# Patient Record
Sex: Female | Born: 1965
Health system: Southern US, Community
[De-identification: ages and names within clinical notes are randomized; demographics above are authoritative.]

## PROBLEM LIST (undated history)

## (undated) HISTORY — PX: CERVICAL CONE BIOPSY: SUR198

## (undated) HISTORY — PX: EYE SURGERY: SHX253

---

## 2017-07-21 HISTORY — PX: REDUCTION MAMMAPLASTY: SUR839

## 2017-12-31 DIAGNOSIS — H6501 Acute serous otitis media, right ear: Secondary | ICD-10-CM | POA: Diagnosis not present

## 2018-01-08 DIAGNOSIS — H9203 Otalgia, bilateral: Secondary | ICD-10-CM | POA: Diagnosis not present

## 2018-01-08 DIAGNOSIS — H9209 Otalgia, unspecified ear: Secondary | ICD-10-CM | POA: Diagnosis not present

## 2018-01-08 DIAGNOSIS — H9190 Unspecified hearing loss, unspecified ear: Secondary | ICD-10-CM | POA: Diagnosis not present

## 2018-01-25 ENCOUNTER — Other Ambulatory Visit: Payer: Self-pay | Admitting: Internal Medicine

## 2018-01-25 DIAGNOSIS — Z1231 Encounter for screening mammogram for malignant neoplasm of breast: Secondary | ICD-10-CM

## 2018-02-10 ENCOUNTER — Ambulatory Visit
Admission: RE | Admit: 2018-02-10 | Discharge: 2018-02-10 | Disposition: A | Discharge status: Home / Self Care | Payer: BLUE CROSS/BLUE SHIELD | Source: Ambulatory Visit | Attending: Internal Medicine | Admitting: Internal Medicine

## 2018-02-10 ENCOUNTER — Encounter: Payer: Self-pay | Admitting: Radiology

## 2018-02-10 DIAGNOSIS — Z1231 Encounter for screening mammogram for malignant neoplasm of breast: Secondary | ICD-10-CM | POA: Insufficient documentation

## 2018-02-24 ENCOUNTER — Other Ambulatory Visit: Payer: Self-pay | Admitting: *Deleted

## 2018-02-24 ENCOUNTER — Inpatient Hospital Stay
Admission: RE | Admit: 2018-02-24 | Discharge: 2018-02-24 | Disposition: A | Discharge status: Home / Self Care | Payer: Self-pay | Source: Ambulatory Visit | Attending: *Deleted | Admitting: *Deleted

## 2018-02-24 DIAGNOSIS — Z9289 Personal history of other medical treatment: Secondary | ICD-10-CM

## 2018-03-21 DIAGNOSIS — Z9889 Other specified postprocedural states: Secondary | ICD-10-CM

## 2018-03-21 HISTORY — DX: Other specified postprocedural states: Z98.890

## 2018-03-21 HISTORY — PX: BREAST REDUCTION SURGERY: SHX8

## 2018-03-31 DIAGNOSIS — N62 Hypertrophy of breast: Secondary | ICD-10-CM | POA: Diagnosis not present

## 2018-04-13 DIAGNOSIS — N62 Hypertrophy of breast: Secondary | ICD-10-CM | POA: Diagnosis not present

## 2018-04-13 DIAGNOSIS — N6031 Fibrosclerosis of right breast: Secondary | ICD-10-CM | POA: Diagnosis not present

## 2018-04-13 DIAGNOSIS — N6032 Fibrosclerosis of left breast: Secondary | ICD-10-CM | POA: Diagnosis not present

## 2018-09-24 ENCOUNTER — Ambulatory Visit: Payer: Self-pay | Admitting: Nurse Practitioner

## 2018-10-11 IMAGING — MG MM DIGITAL SCREENING BILAT W/ TOMO W/ CAD
8 series · 8 of 24 positions shown · non-contrast
Comparison: Previous exam(s).

CLINICAL DATA: Screening.

EXAM:
DIGITAL SCREENING BILATERAL MAMMOGRAM WITH TOMO AND CAD

[R CC synth-2D]
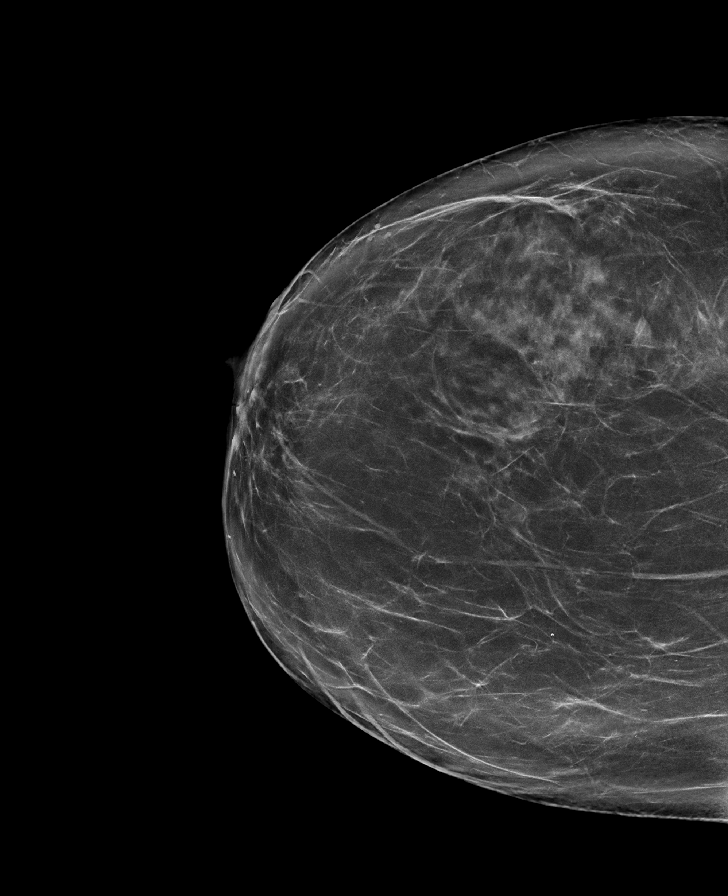

[R MLO synth-2D]
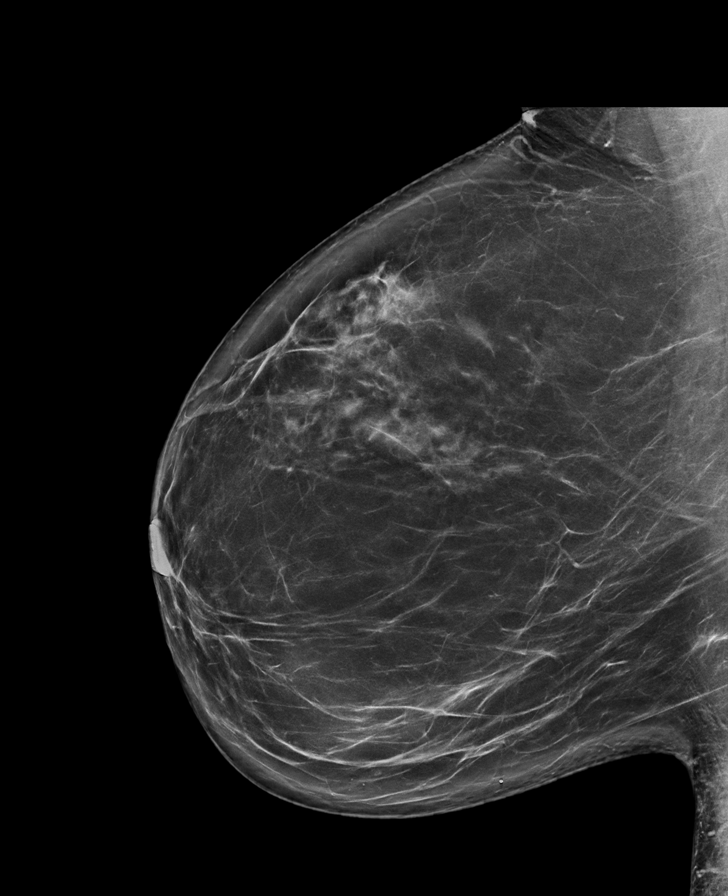

[L MLO synth-2D]
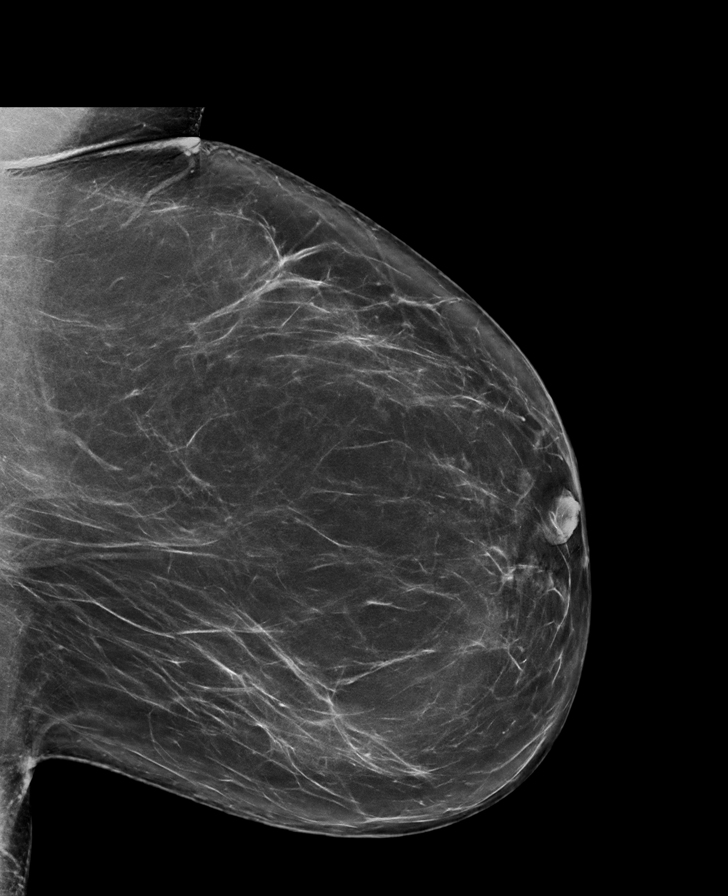

[L CC synth-2D]
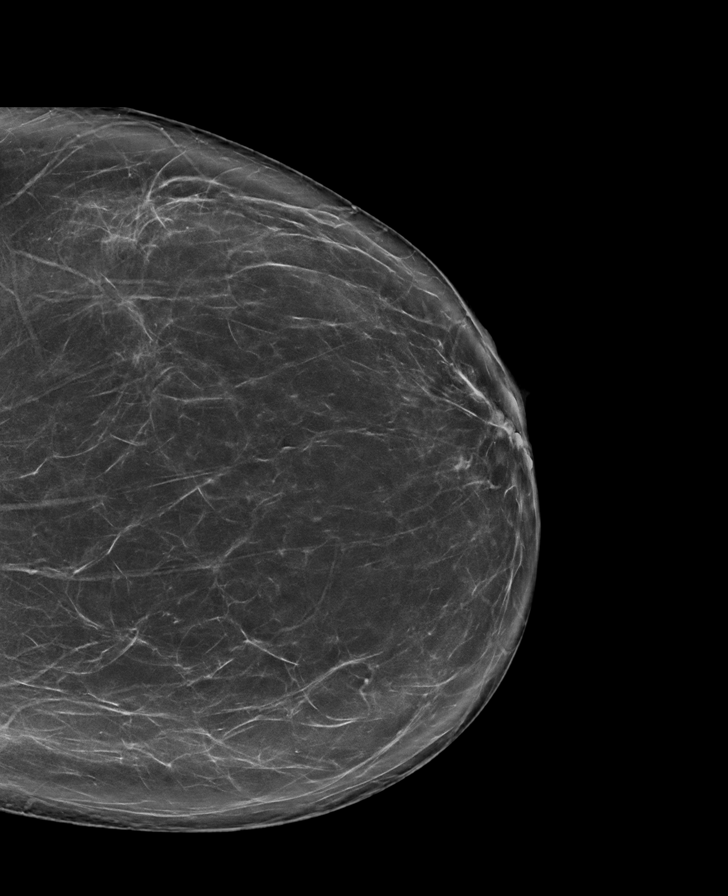

[L MLO tomo · tomo slice 43/86.0]
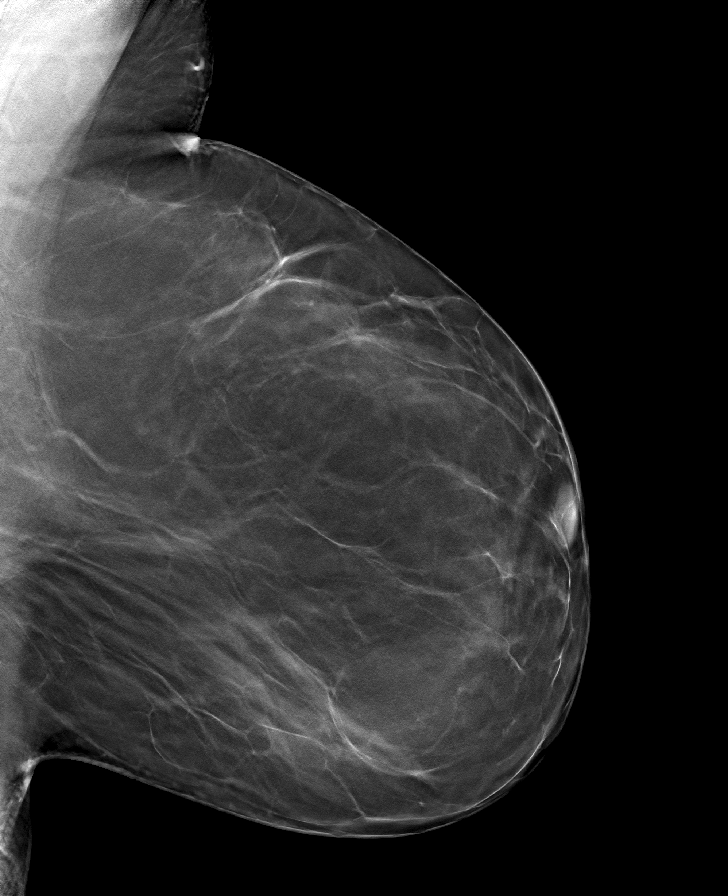

[L CC tomo · tomo slice 47/92.0]
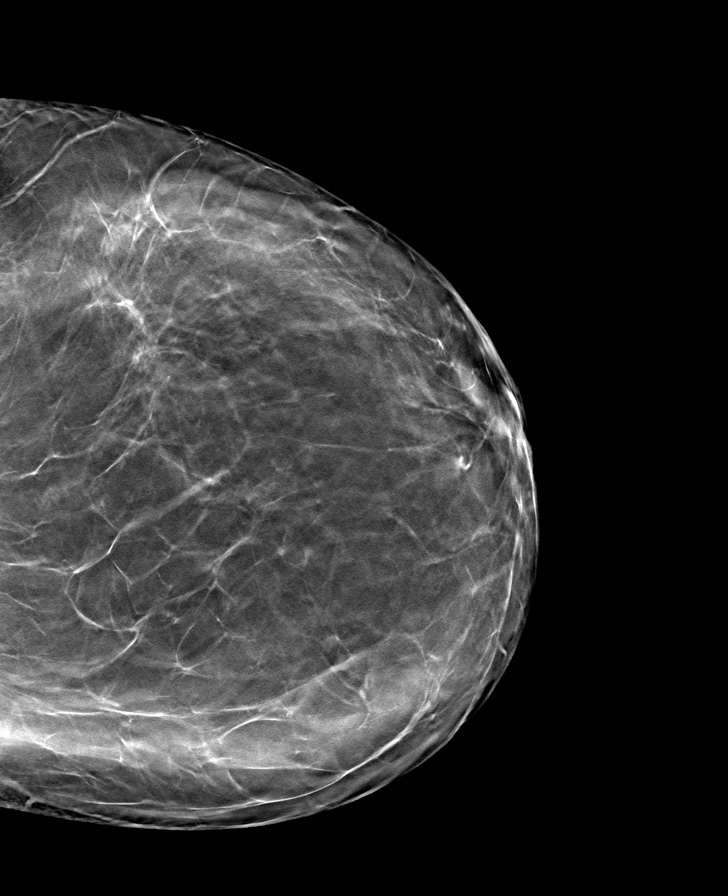

[R CC tomo · tomo slice 48/95.0]
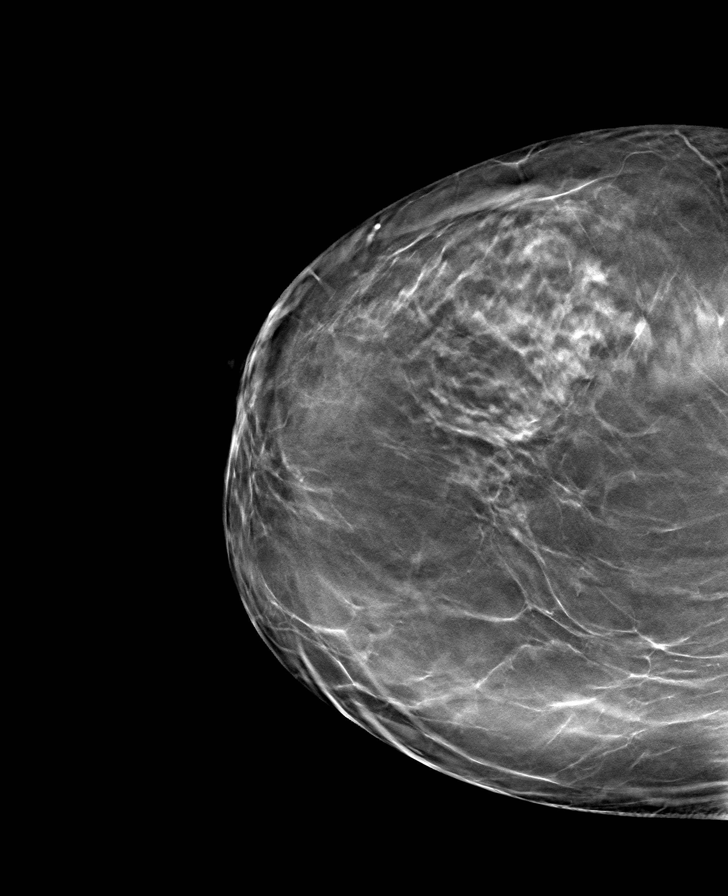

[R MLO tomo · tomo slice 47/94.0]
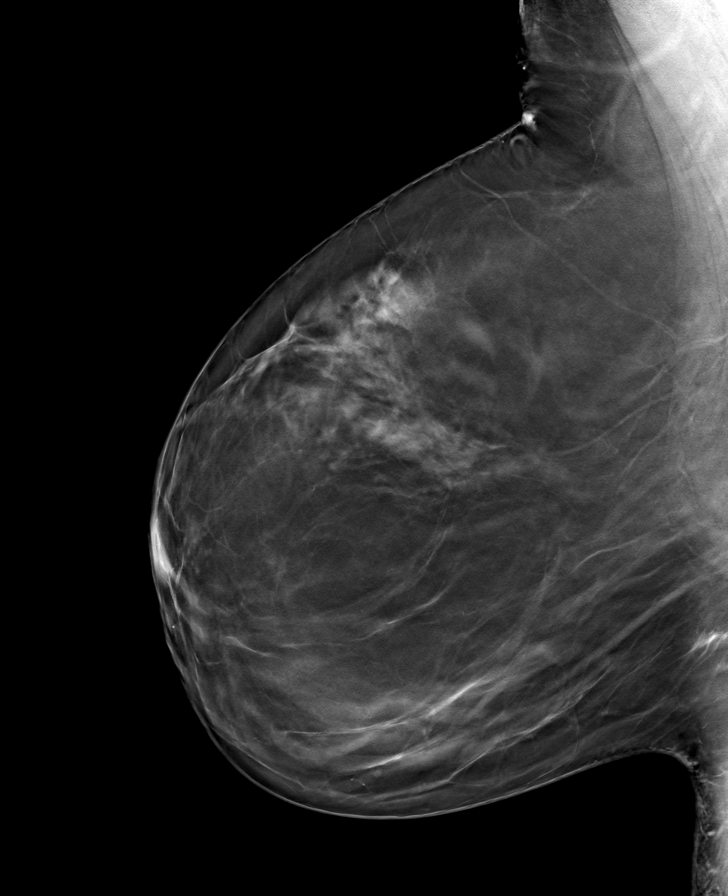

[8 of 24 positions shown; findings below may reference images not displayed]

ACR Breast Density Category b: There are scattered areas of
fibroglandular density.
FINDINGS: There are no findings suspicious for malignancy. Images were
processed with CAD.
IMPRESSION: No mammographic evidence of malignancy. A result letter of this
screening mammogram will be mailed directly to the patient.

RECOMMENDATION:
Screening mammogram in one year. (Code:[id number])

BI-RADS CATEGORY  1: Negative.

## 2019-06-22 DIAGNOSIS — M7502 Adhesive capsulitis of left shoulder: Secondary | ICD-10-CM | POA: Diagnosis not present

## 2019-06-22 DIAGNOSIS — G8929 Other chronic pain: Secondary | ICD-10-CM | POA: Diagnosis not present

## 2019-06-22 DIAGNOSIS — M7542 Impingement syndrome of left shoulder: Secondary | ICD-10-CM | POA: Diagnosis not present

## 2019-06-22 DIAGNOSIS — M25512 Pain in left shoulder: Secondary | ICD-10-CM | POA: Diagnosis not present

## 2019-11-08 ENCOUNTER — Other Ambulatory Visit: Payer: Self-pay

## 2019-11-08 ENCOUNTER — Ambulatory Visit (INDEPENDENT_AMBULATORY_CARE_PROVIDER_SITE_OTHER): Payer: BC Managed Care – PPO | Admitting: Family Medicine

## 2019-11-08 ENCOUNTER — Encounter: Payer: Self-pay | Admitting: Family Medicine

## 2019-11-08 VITALS — BP 124/75 | HR 70 | Temp 97.3°F | Ht 62.5 in | Wt 185.2 lb

## 2019-11-08 DIAGNOSIS — F32A Depression, unspecified: Secondary | ICD-10-CM | POA: Insufficient documentation

## 2019-11-08 DIAGNOSIS — Z7689 Persons encountering health services in other specified circumstances: Secondary | ICD-10-CM

## 2019-11-08 DIAGNOSIS — Z131 Encounter for screening for diabetes mellitus: Secondary | ICD-10-CM

## 2019-11-08 DIAGNOSIS — F320 Major depressive disorder, single episode, mild: Secondary | ICD-10-CM

## 2019-11-08 DIAGNOSIS — Z716 Tobacco abuse counseling: Secondary | ICD-10-CM | POA: Diagnosis not present

## 2019-11-08 DIAGNOSIS — F418 Other specified anxiety disorders: Secondary | ICD-10-CM | POA: Diagnosis not present

## 2019-11-08 DIAGNOSIS — Z1211 Encounter for screening for malignant neoplasm of colon: Secondary | ICD-10-CM

## 2019-11-08 DIAGNOSIS — G4700 Insomnia, unspecified: Secondary | ICD-10-CM | POA: Insufficient documentation

## 2019-11-08 DIAGNOSIS — Z0000 Encounter for general adult medical examination without abnormal findings: Secondary | ICD-10-CM

## 2019-11-08 DIAGNOSIS — F5101 Primary insomnia: Secondary | ICD-10-CM

## 2019-11-08 DIAGNOSIS — F419 Anxiety disorder, unspecified: Secondary | ICD-10-CM

## 2019-11-08 DIAGNOSIS — Z13220 Encounter for screening for lipoid disorders: Secondary | ICD-10-CM

## 2019-11-08 DIAGNOSIS — F329 Major depressive disorder, single episode, unspecified: Secondary | ICD-10-CM | POA: Insufficient documentation

## 2019-11-08 DIAGNOSIS — Z1329 Encounter for screening for other suspected endocrine disorder: Secondary | ICD-10-CM

## 2019-11-08 DIAGNOSIS — R8290 Unspecified abnormal findings in urine: Secondary | ICD-10-CM | POA: Diagnosis not present

## 2019-11-08 DIAGNOSIS — R635 Abnormal weight gain: Secondary | ICD-10-CM

## 2019-11-08 LAB — POCT URINALYSIS DIPSTICK
Bilirubin, UA: NEGATIVE
Glucose, UA: NEGATIVE
Ketones, UA: NEGATIVE
Leukocytes, UA: NEGATIVE
Nitrite, UA: NEGATIVE
Protein, UA: NEGATIVE
Spec Grav, UA: 1.015 (ref 1.010–1.025)
Urobilinogen, UA: 0.2 E.U./dL
pH, UA: 5 (ref 5.0–8.0)

## 2019-11-08 MED ORDER — BUPROPION HCL ER (XL) 150 MG PO TB24: 150.0000 mg | ORAL_TABLET | Freq: Every day | ORAL | 1 refills | Status: AC

## 2019-11-08 MED ORDER — HYDROXYZINE HCL 10 MG PO TABS: ORAL_TABLET | ORAL | 1 refills | Status: AC

## 2019-11-08 NOTE — Progress Notes (Signed)
Subjective:    Patient ID: Monica Conley, female    DOB: [birth year], 54 y.o.   MRN: [id number]  Monica Conley is a 54 y.o. female presenting on 11/08/2019 for Establish Care   HPI  Previous PCP was at [facility], [place] with [facility].  Records will be requested.  Past medical, family, and surgical history reviewed w/ pt.  Has acute concerns for anxiety with depression, not sleeping well, and desire to quit smoking. Reports since the COVID-19 pandemic has begun, she has been stress eating and reports gaining > 25lbs.  No structured diet or exercise routine.  No sleep hygiene routine.  Finds has mostly interrupted sleep at night due to anxiety and thoughts about work.  Depression screen [facility] 2/9 11/08/2019  Decreased Interest 3  Down, Depressed, Hopeless 3  PHQ - 2 Score 6  Altered sleeping 3  Tired, decreased energy 3  Change in appetite 3  Feeling bad or failure about yourself  1  Trouble concentrating 0  Moving slowly or fidgety/restless 0  Suicidal thoughts 0  PHQ-9 Score 16  Difficult doing work/chores Somewhat difficult    Social History   Tobacco Use  . Smoking status: Current Every Day Smoker    Packs/day: 1.00    Years: 20.00    Pack years: 20.00    Types: Cigarettes  . Smokeless tobacco: Never Used  Substance Use Topics  . Alcohol use: Yes    Comment: occasional  . Drug use: Never    Review of Systems  Constitutional: Negative.   HENT: Negative.   Eyes: Negative.   Respiratory: Negative.   Cardiovascular: Negative.   Gastrointestinal: Negative.   Endocrine: Negative.   Genitourinary: Negative.   Musculoskeletal: Negative.   Skin: Negative.   Allergic/Immunologic: Negative.   Neurological: Negative.   Hematological: Negative.   Psychiatric/Behavioral: Negative.    Per HPI unless specifically indicated above     Objective:    BP 124/75 (BP Location: Left Arm, Patient Position: Sitting, Cuff Size: Normal)   Pulse 70   Temp (!) 97.3  F (36.3 C) (Temporal)   Ht 5' 2.5" (1.588 m)   Wt 185 lb 3.2 oz (84 kg)   SpO2 98%   BMI 33.33 kg/m   Wt Readings from Last 3 Encounters:  11/08/19 185 lb 3.2 oz (84 kg)    Physical Exam Vitals reviewed.  Constitutional:      General: She is not in acute distress.    Appearance: Normal appearance. She is well-developed and well-groomed. She is obese. She is not ill-appearing or toxic-appearing.  HENT:     Head: Normocephalic.     Mouth/Throat:     Lips: Pink.     Pharynx: Uvula midline.  Eyes:     General: Lids are normal. Vision grossly intact. No scleral icterus.       Right eye: No discharge.        Left eye: No discharge.     Extraocular Movements: Extraocular movements intact.     Conjunctiva/sclera: Conjunctivae normal.     Pupils: Pupils are equal, round, and reactive to light.  Neck:     Thyroid: No thyroid mass or thyromegaly.  Cardiovascular:     Rate and Rhythm: Normal rate and regular rhythm.     Pulses: Normal pulses.          Dorsalis pedis pulses are 2+ on the right side and 2+ on the left side.       Posterior tibial pulses  are 2+ on the right side and 2+ on the left side.     Heart sounds: Normal heart sounds. No murmur. No friction rub. No gallop.   Pulmonary:     Effort: Pulmonary effort is normal. No respiratory distress.     Breath sounds: Normal breath sounds.  Abdominal:     General: Abdomen is flat. Bowel sounds are normal. There is no distension.     Palpations: Abdomen is soft. There is no hepatomegaly, splenomegaly or mass.     Tenderness: There is no abdominal tenderness. There is no guarding or rebound.     Hernia: No hernia is present.  Musculoskeletal:        General: Normal range of motion.     Right lower leg: No edema.     Left lower leg: No edema.  Feet:     Right foot:     Skin integrity: Skin integrity normal.     Left foot:     Skin integrity: Skin integrity normal.  Skin:    General: Skin is warm and dry.     Capillary  Refill: Capillary refill takes less than 2 seconds.  Neurological:     General: No focal deficit present.     Mental Status: She is alert and oriented to person, place, and time.     Cranial Nerves: No cranial nerve deficit.     Sensory: No sensory deficit.     Motor: No weakness.     Coordination: Coordination normal.     Gait: Gait normal.     Deep Tendon Reflexes: Reflexes normal.  Psychiatric:        Attention and Perception: Attention and perception normal.        Mood and Affect: Mood and affect normal.        Speech: Speech normal.        Behavior: Behavior normal. Behavior is cooperative.        Thought Content: Thought content normal.        Cognition and Memory: Cognition and memory normal.        Judgment: Judgment normal.     Results for orders placed or performed in visit on 11/08/19  POCT Urinalysis Dipstick  Result Value Ref Range   Color, UA Yellow    Clarity, UA clear    Glucose, UA Negative Negative   Bilirubin, UA negative    Ketones, UA negative    Spec Grav, UA 1.015 1.010 - 1.025   Blood, UA trace    pH, UA 5.0 5.0 - 8.0   Protein, UA Negative Negative   Urobilinogen, UA 0.2 0.2 or 1.0 E.U./dL   Nitrite, UA negative    Leukocytes, UA Negative Negative   Appearance     Odor        Assessment & Plan:   Problem List Items Addressed This Visit      Other   Encounter to establish care with new doctor    New patient establishment for primary care here at [facility] on 11/08/2019.  Previous PCP was in [place], [place].  Records to be requested and will determine what preventative health screenings is due for.  Follow up in 1-2 months for CPE      Anxiety    See depression A/P      Relevant Medications   buPROPion (WELLBUTRIN XL) 150 MG 24 hr tablet   hydrOXYzine (ATARAX/VISTARIL) 10 MG tablet   Depression    History of depression, now with increased  anxiety since the pandemic has started.  Reports weight gain > 25lbs and desire for smoking cessation.   Discussed different options and patient requesting to try Wellbutrin.  Reports had taken Chantix in the past with side effects.    Plan: 1. Begin Wellbutrin XL 150mg  once daily 2. Mood education hand out provided with different activities to help mood disturbances. 3. Will see you back in clinic in 1-2 months, sooner, should the need arise      Relevant Medications   buPROPion (WELLBUTRIN XL) 150 MG 24 hr tablet   hydrOXYzine (ATARAX/VISTARIL) 10 MG tablet   Insomnia    Currently having insomnia with increased anxiety.  Sleep is interrupted nightly.  Has tried melatonin in the past for help with sleep without relief.  Plan: 1. Handout with sleep hygiene information provided to work towards a sleep hygiene routine 2. Can try hydroxyzine 10mg  to take 1 hour before bedtime while working on sleep hygiene routine 3. Will follow up in 1-2 months, or sooner, if the need arises      Relevant Medications   hydrOXYzine (ATARAX/VISTARIL) 10 MG tablet    Other Visit Diagnoses    Screening for colon cancer    -  Primary   Relevant Orders   Cologuard   Encounter for smoking cessation counseling       Relevant Medications   buPROPion (WELLBUTRIN XL) 150 MG 24 hr tablet   Depression with anxiety       Relevant Medications   buPROPion (WELLBUTRIN XL) 150 MG 24 hr tablet   hydrOXYzine (ATARAX/VISTARIL) 10 MG tablet   Routine medical exam       Relevant Orders   CBC with Differential   COMPLETE METABOLIC PANEL WITH GFR   Lipid Profile   Thyroid Panel With TSH   HgB A1c   Weight gain       Relevant Orders   Lipid Profile   Thyroid Panel With TSH   HgB A1c   Screening for lipid disorders       Relevant Orders   Lipid Profile   Screening for diabetes mellitus       Relevant Orders   HgB A1c   Screening for thyroid disorder       Relevant Orders   Thyroid Panel With TSH   Abnormal urinalysis       Relevant Orders   Urine Culture   POCT Urinalysis Dipstick (Completed)      Meds  ordered this encounter  Medications  . buPROPion (WELLBUTRIN XL) 150 MG 24 hr tablet    Sig: Take 1 tablet (150 mg total) by mouth daily.    Dispense:  90 tablet    Refill:  1  . hydrOXYzine (ATARAX/VISTARIL) 10 MG tablet    Sig: 1-2 tablets at bedtime as needed for anxiety/sleep    Dispense:  60 tablet    Refill:  1      Follow up plan: Return in about 2 months (around 01/08/2020) for Physical.   , FNP Family Nurse Practitioner [facility] 11/08/2019, 2:32 PM

## 2019-11-08 NOTE — Assessment & Plan Note (Signed)
New patient establishment for primary care here at [facility] on 11/08/2019.  Previous PCP was in [place], [place].  Records to be requested and will determine what preventative health screenings is due for.  Follow up in 1-2 months for CPE

## 2019-11-08 NOTE — Assessment & Plan Note (Signed)
See depression A/P. 

## 2019-11-08 NOTE — Assessment & Plan Note (Signed)
History of depression, now with increased anxiety since the pandemic has started.  Reports weight gain > 25lbs and desire for smoking cessation.  Discussed different options and patient requesting to try Wellbutrin.  Reports had taken Chantix in the past with side effects.    Plan: 1. Begin Wellbutrin XL 150mg  once daily 2. Mood education hand out provided with different activities to help mood disturbances. 3. Will see you back in clinic in 1-2 months, sooner, should the need arise

## 2019-11-08 NOTE — Assessment & Plan Note (Signed)
Currently having insomnia with increased anxiety.  Sleep is interrupted nightly.  Has tried melatonin in the past for help with sleep without relief.  Plan: 1. Handout with sleep hygiene information provided to work towards a sleep hygiene routine 2. Can try hydroxyzine 10mg  to take 1 hour before bedtime while working on sleep hygiene routine 3. Will follow up in 1-2 months, or sooner, if the need arises

## 2019-11-08 NOTE — Patient Instructions (Signed)
We will request copies of your medical records from your previous PCP.  I have sent in a prescription for Wellbutrin to take 1 tablet daily for smoking cessation and anxiety with depression.  This medication may take up to 6 weeks to see a full effect.  Sleep hygiene is the single most effective treatment for sleep issues, but it is hard work.  Tips for a good night's sleep:  -Keep sleep environment comfortable and conducive to sleep -Keep regular sleep schedule 7 nights a week -Avoiding naps during the day -Avoiding going to bed until drowsy and ready to sleep, not trying to sleep, and not watching the clock -Get out of bed if not asleep within 15-20 minutes and returning only when drowsy -Avoiding caffeine, nicotine, alcohol, and other substances that interfere with sleep before bedtime -Take an hour before your set bedtime and start to wind down: bath/shower, no more TV or phone (the blue light can interfere with sleeping), listen to soothing music, or meditation -No TV in your bedroom -Exercising regularly, at least 6 hours before sleep. Yoga and Tai Chi can improve sleep quality  There are a lot of books and apps that may help guide you with any of the following:   -Progressive muscle relaxation (involves methodical tension and relaxation of different Muscle groups throughout body)  Guided imagery  -YouTube - Gwynne Edinger has free videos on YouTube that can help with meditation and some   Abdominal breathing   Over the counter sleep aid one hour before bed- and gradually wean your use over 2-4 weeks  Some examples are : *Melatonin 5-10 mg *Sleepology (Can find on [employer]) taken according to packaging directions  There are a few online evidence based online programs, unfortunately they are not free.   Developed by a sleep expert who created a drug-free program for insomnia proven more effective than sleeping pills.  www.cbtforinsomnia.com Sleepio is an evidence-based  digital sleep improvement program   www.sleepio.com SHUTi is designed to actively help retrain your body and mind for great sleep through six engaging Cognitive Behavioral Therapy for Insomnia strategy and learning sessions  BloggerCourse.com  The following recommendations are helpful adjuncts for helping rebalance your mood.  Eat a nourishing diet. Ensure adequate intake of calories, protein, carbs, fat, vitamins, and minerals. Prioritize whole foods at each meal, including meats, vegetables, fruits, nuts and seeds, etc.   Avoid inflammatory and/or "junk" foods, such as sugar, omega-6 fats, refined grains, chemicals, and preservatives are common in packaged and prepared foods. Minimize or completely avoid these ingredients and stick to whole foods with little to no additives. Cook from scratch as much as possible for more control over what you eat  Get enough sleep. Poor sleep is significantly associated with depression and anxiety. Make 7-9 hours of sleep nightly a top priority  Exercise appropriately. Exercise is known to improve brain functioning and boost mood. Aim for 30 minutes of daily physical activity. Avoid "overtraining," which can cause mental disturbances  Assess your light exposure. Not enough natural light during the day and too much artificial light can have a major impact on your mood. Get outside as often as possible during daylight hours. Minimize light exposure after dark and avoid the use of electronics that give off blue light before bed  Manage your stress.  Use daily stress management techniques such as meditation, yoga, or mindfulness to retrain your brain to respond differently to stress. Try deep breathing to deactivate your "fight or flight" response.  There  are many of sources with apps like Headspace, Calm or a variety of YouTube videos (videos from Gwynne Edinger have guided meditation)  Prioritize your social life. Work on building social support with new  friends or improve current relationships. Consider getting a pet that allows for companionship, social interaction, and physical touch. Try volunteering or joining a faith-based community to increase your sense of purpose  4-7-8 breathing technique at bedtime: breathe in to count of 4, hold breath for count of 7, exhale for count of 8; do 3-5 times for letting go of overactive thoughts  Take time to play Unstructured "play" time can help reduce anxiety and depression Options for play include music, games, sports, dance, art, etc.  Try to add daily omega 3 fatty acids, magnesium, B complex, and balanced amino acid supplements to help improve mood and anxiety.  For your smoking cessation, we are going to start Wellbutrin: Start bupropion SR (Wellbutrin SR) '150mg'$  tablet 7 days BEFORE your quit date.     To quit smoking:  - Only start this treatment if you are mentally ready to quit. - Choose a quit date at least 7 days in the future.  - Start taking the medicine about 1-2 weeks before your quit date.  - Reduce the number of cigarettes you smoke each day you are on this medication until you eventually QUIT COMPLETELY on your quit date.  - Continue taking the Wellbutrin twice daily for total 7 weeks, we can continue this medication for up to 12 weeks.  We will plan to see you back in 1-2 months for physical  You will receive a survey after today's visit either digitally by e-mail or paper by [place] mail. Your experiences and feedback matter to [place].  Please respond so we know how we are doing as we provide care for you.  Call us with any questions/concerns/needs.  It is my goal to be available to you for your health concerns.  Thanks for choosing me to be a partner in your healthcare needs!  Harlin Rain, FNP-C Family Nurse Practitioner [facility] Phone: [phone]

## 2019-11-09 ENCOUNTER — Other Ambulatory Visit: Payer: BC Managed Care – PPO

## 2019-11-09 LAB — URINE CULTURE
MICRO NUMBER:: 10384430
SPECIMEN QUALITY:: ADEQUATE

## 2019-11-10 ENCOUNTER — Encounter: Payer: Self-pay | Admitting: Family Medicine

## 2019-11-10 DIAGNOSIS — E785 Hyperlipidemia, unspecified: Secondary | ICD-10-CM | POA: Insufficient documentation

## 2019-11-10 LAB — CBC WITH DIFFERENTIAL/PLATELET
Absolute Monocytes: 448 cells/uL (ref 200–950)
Basophils Absolute: 63 cells/uL (ref 0–200)
Basophils Relative: 0.9 %
Eosinophils Absolute: 252 cells/uL (ref 15–500)
Eosinophils Relative: 3.6 %
HCT: 45.6 % — ABNORMAL HIGH (ref 35.0–45.0)
Hemoglobin: 15.3 g/dL (ref 11.7–15.5)
Lymphs Abs: 1813 cells/uL (ref 850–3900)
MCH: 29.7 pg (ref 27.0–33.0)
MCHC: 33.6 g/dL (ref 32.0–36.0)
MCV: 88.5 fL (ref 80.0–100.0)
MPV: 11.1 fL (ref 7.5–12.5)
Monocytes Relative: 6.4 %
Neutro Abs: 4424 cells/uL (ref 1500–7800)
Neutrophils Relative %: 63.2 %
Platelets: 236 10*3/uL (ref 140–400)
RBC: 5.15 10*6/uL — ABNORMAL HIGH (ref 3.80–5.10)
RDW: 12.8 % (ref 11.0–15.0)
Total Lymphocyte: 25.9 %
WBC: 7 10*3/uL (ref 3.8–10.8)

## 2019-11-10 LAB — COMPLETE METABOLIC PANEL WITHOUT GFR
AG Ratio: 1.6 (calc) (ref 1.0–2.5)
ALT: 34 U/L — ABNORMAL HIGH (ref 6–29)
AST: 21 U/L (ref 10–35)
Albumin: 4 g/dL (ref 3.6–5.1)
Alkaline phosphatase (APISO): 42 U/L (ref 37–153)
BUN: 12 mg/dL (ref 7–25)
CO2: 28 mmol/L (ref 20–32)
Calcium: 9.4 mg/dL (ref 8.6–10.4)
Chloride: 108 mmol/L (ref 98–110)
Creat: 0.76 mg/dL (ref 0.50–1.05)
GFR, Est African American: 105 mL/min/1.73m2
GFR, Est Non African American: 91 mL/min/1.73m2
Globulin: 2.5 g/dL (ref 1.9–3.7)
Glucose, Bld: 119 mg/dL — ABNORMAL HIGH (ref 65–99)
Potassium: 4.3 mmol/L (ref 3.5–5.3)
Sodium: 142 mmol/L (ref 135–146)
Total Bilirubin: 0.3 mg/dL (ref 0.2–1.2)
Total Protein: 6.5 g/dL (ref 6.1–8.1)

## 2019-11-10 LAB — THYROID PANEL WITH TSH
Free Thyroxine Index: 2.6 (ref 1.4–3.8)
T3 Uptake: 29 % (ref 22–35)
T4, Total: 8.9 ug/dL (ref 5.1–11.9)
TSH: 1.43 mIU/L

## 2019-11-10 LAB — HEMOGLOBIN A1C
Hgb A1c MFr Bld: 5.5 % of total Hgb (ref <5.7)
Mean Plasma Glucose: 111 (calc)
eAG (mmol/L): 6.2 (calc)

## 2019-11-10 LAB — LIPID PANEL
Cholesterol: 209 mg/dL — ABNORMAL HIGH
HDL: 44 mg/dL — ABNORMAL LOW
LDL Cholesterol (Calc): 136 mg/dL — ABNORMAL HIGH
Non-HDL Cholesterol (Calc): 165 mg/dL — ABNORMAL HIGH
Total CHOL/HDL Ratio: 4.8 (calc)
Triglycerides: 154 mg/dL — ABNORMAL HIGH

## 2019-11-10 NOTE — Progress Notes (Signed)
CBC within normal limits, CMP with elevated glucose but A1C was 5.5% so just elevated, no prediabetes or diabetes.  Cholesterol is elevated, which can be is was not fasting for a good 8-12 hours before the test.  Her numbers are not too far off from normal, we can have her work on lowering her dietary cholesterol intake, following a mediterranean diet as well and focusing on exercise when she can to help reduce her weight and we should hopefully see better lipid levels in 3 months.  We can plan to repeat in 3 months.

## 2020-01-11 ENCOUNTER — Ambulatory Visit (INDEPENDENT_AMBULATORY_CARE_PROVIDER_SITE_OTHER): Payer: BC Managed Care – PPO | Admitting: Family Medicine

## 2020-01-11 ENCOUNTER — Encounter: Payer: Self-pay | Admitting: Family Medicine

## 2020-01-11 ENCOUNTER — Other Ambulatory Visit: Payer: Self-pay

## 2020-01-11 ENCOUNTER — Other Ambulatory Visit (HOSPITAL_COMMUNITY)
Admission: RE | Admit: 2020-01-11 | Discharge: 2020-01-11 | Disposition: A | Discharge status: Home / Self Care | Payer: BC Managed Care – PPO | Source: Ambulatory Visit | Attending: Family Medicine | Admitting: Family Medicine

## 2020-01-11 VITALS — BP 121/75 | HR 70 | Temp 97.1°F | Ht 62.5 in | Wt 179.4 lb

## 2020-01-11 DIAGNOSIS — E782 Mixed hyperlipidemia: Secondary | ICD-10-CM

## 2020-01-11 DIAGNOSIS — Z124 Encounter for screening for malignant neoplasm of cervix: Secondary | ICD-10-CM | POA: Insufficient documentation

## 2020-01-11 DIAGNOSIS — Z1239 Encounter for other screening for malignant neoplasm of breast: Secondary | ICD-10-CM | POA: Insufficient documentation

## 2020-01-11 DIAGNOSIS — Z01419 Encounter for gynecological examination (general) (routine) without abnormal findings: Secondary | ICD-10-CM

## 2020-01-11 DIAGNOSIS — R8290 Unspecified abnormal findings in urine: Secondary | ICD-10-CM

## 2020-01-11 DIAGNOSIS — Z0000 Encounter for general adult medical examination without abnormal findings: Secondary | ICD-10-CM | POA: Insufficient documentation

## 2020-01-11 DIAGNOSIS — Z1231 Encounter for screening mammogram for malignant neoplasm of breast: Secondary | ICD-10-CM

## 2020-01-11 DIAGNOSIS — F320 Major depressive disorder, single episode, mild: Secondary | ICD-10-CM

## 2020-01-11 DIAGNOSIS — Z1211 Encounter for screening for malignant neoplasm of colon: Secondary | ICD-10-CM

## 2020-01-11 DIAGNOSIS — F5101 Primary insomnia: Secondary | ICD-10-CM

## 2020-01-11 LAB — POCT URINALYSIS DIPSTICK
Bilirubin, UA: NEGATIVE
Glucose, UA: NEGATIVE
Ketones, UA: NEGATIVE
Leukocytes, UA: NEGATIVE
Nitrite, UA: NEGATIVE
Protein, UA: NEGATIVE
Spec Grav, UA: 1.02 (ref 1.010–1.025)
Urobilinogen, UA: 0.2 U/dL
pH, UA: 5 (ref 5.0–8.0)

## 2020-01-11 MED ORDER — TRAZODONE HCL 50 MG PO TABS: 25.0000 mg | ORAL_TABLET | Freq: Every evening | ORAL | 3 refills | Status: AC | PRN

## 2020-01-11 NOTE — Assessment & Plan Note (Signed)
Has tried hydroxyzine without change in her insomnia.  Discussed trying trazodone 25-50mg  to see if she has symptom relief with this.  Plan: 1. Begin trazodone 25-50mg  at bedtime for insomnia 2. Follow up in 3 months

## 2020-01-11 NOTE — Assessment & Plan Note (Signed)
Has been taking Wellbutrin XL 150mg  daily with reported improvement in depression & anxiety.  Reports new situational anxiety event that has happened, which has increased symptoms somewhat.  Plan to continue on Wellbutrin XL at this time.  Plan: 1. Continue Wellbutrin XL 150mg  daily.

## 2020-01-11 NOTE — Assessment & Plan Note (Signed)
Annual physical exam without new findings.  Well adult with no acute concerns.  Plan: 1. Obtain health maintenance screenings as above according to age. - Increase physical activity to 30 minutes most days of the week.  - Eat healthy diet high in vegetables and fruits; low in refined carbohydrates. - Screening labs and tests as ordered 2. Return 1 year for annual physical.  

## 2020-01-11 NOTE — Progress Notes (Signed)
Subjective:    Patient ID: Monica Conley, female    DOB: 1966/06/11, 54 y.o.   MRN: [id number]  Monica Conley is a 54 y.o. female presenting on 01/11/2020 for Annual Exam   HPI  HEALTH MAINTENANCE:  Weight/BMI: Obese, BMI 32.29 Physical activity: Stays active Diet: Regular Seatbelt: Yes Sunscreen: Yes Mammogram: Due, Ordered  PAP: Due, completed today Colon cancer screening: Has Cologuard at home, will work on completing HIV & Hep C Screening: Offered and declined GC/CT: Offered and declined Optometry: As needed Dentistry: As needed  IMMUNIZATIONS: Influenza: Due next season Tetanus: Due COVID: Completed series  Depression screen [facility] 2/9 01/11/2020 11/08/2019  Decreased Interest 0 3  Down, Depressed, Hopeless 0 3  PHQ - 2 Score 0 6  Altered sleeping - 3  Tired, decreased energy - 3  Change in appetite - 3  Feeling bad or failure about yourself  - 1  Trouble concentrating - 0  Moving slowly or fidgety/restless - 0  Suicidal thoughts - 0  PHQ-9 Score - 16  Difficult doing work/chores - Somewhat difficult    Past Medical History:  Diagnosis Date  . Hx of bilateral breast reduction surgery 03/2018   Past Surgical History:  Procedure Laterality Date  . BREAST REDUCTION SURGERY Bilateral 03/2018  . CERVICAL CONE BIOPSY    . EYE SURGERY     Social History   Socioeconomic History  . Marital status: Married    Spouse name: Not on file  . Number of children: Not on file  . Years of education: Not on file  . Highest education level: Not on file  Occupational History  . Not on file  Tobacco Use  . Smoking status: Current Every Day Smoker    Packs/day: 1.00    Years: 20.00    Pack years: 20.00    Types: Cigarettes  . Smokeless tobacco: Never Used  Vaping Use  . Vaping Use: Never used  Substance and Sexual Activity  . Alcohol use: Yes    Comment: occasional  . Drug use: Never  . Sexual activity: Not on file  Other Topics Concern  . Not on file    Social History Narrative  . Not on file   Social Determinants of Health   Financial Resource Strain:   . Difficulty of Paying Living Expenses:   Food Insecurity:   . Worried About Programme researcher, broadcasting/film/video in the Last Year:   . Barista in the Last Year:   Transportation Needs:   . Freight forwarder (Medical):   Marland Kitchen Lack of Transportation (Non-Medical):   Physical Activity:   . Days of Exercise per Week:   . Minutes of Exercise per Session:   Stress:   . Feeling of Stress :   Social Connections:   . Frequency of Communication with Friends and Family:   . Frequency of Social Gatherings with Friends and Family:   . Attends Religious Services:   . Active Member of Clubs or Organizations:   . Attends Banker Meetings:   Marland Kitchen Marital Status:   Intimate Partner Violence:   . Fear of Current or Ex-Partner:   . Emotionally Abused:   Marland Kitchen Physically Abused:   . Sexually Abused:    Family History  Problem Relation Age of Onset  . Depression Mother   . Cancer Mother   . Lung cancer Mother   . Liver cancer Mother   . Heart disease Father    Current Outpatient Medications on  File Prior to Visit  Medication Sig  . B Complex Vitamins (VITAMIN B COMPLEX) TABS Take by mouth.  Marland Kitchen buPROPion (WELLBUTRIN XL) 150 MG 24 hr tablet Take 1 tablet (150 mg total) by mouth Conley.  . cholecalciferol (VITAMIN D3) 25 MCG (1000 UNIT) tablet Take 1,000 Units by mouth Conley.  . hydrOXYzine (ATARAX/VISTARIL) 10 MG tablet 1-2 tablets at bedtime as needed for anxiety/sleep (Patient not taking: Reported on 01/11/2020)   No current facility-administered medications on file prior to visit.    Per HPI unless specifically indicated above     Objective:    BP 121/75 (BP Location: Left Arm, Patient Position: Sitting, Cuff Size: Normal)   Pulse 70   Temp (!) 97.1 F (36.2 C) (Temporal)   Ht 5' 2.5" (1.588 m)   Wt 179 lb 6.4 oz (81.4 kg)   SpO2 100%   BMI 32.29 kg/m   Wt Readings from Last  3 Encounters:  01/11/20 179 lb 6.4 oz (81.4 kg)  11/08/19 185 lb 3.2 oz (84 kg)   Physical Exam Vitals reviewed.  Constitutional:      General: She is not in acute distress.    Appearance: Normal appearance. She is well-developed and well-groomed. She is obese. She is not ill-appearing or toxic-appearing.  HENT:     Head: Normocephalic and atraumatic.     Right Ear: Tympanic membrane, ear canal and external ear normal. There is no impacted cerumen.     Left Ear: Tympanic membrane, ear canal and external ear normal. There is no impacted cerumen.     Nose: Nose normal. No congestion or rhinorrhea.     Comments: Lesia Sago is in place, covering mouth and nose.    Mouth/Throat:     Lips: Pink.     Mouth: Mucous membranes are moist.     Pharynx: Oropharynx is clear. Uvula midline. No oropharyngeal exudate or posterior oropharyngeal erythema.  Eyes:     General: Lids are normal. Vision grossly intact. No scleral icterus.       Right eye: No discharge.        Left eye: No discharge.     Extraocular Movements: Extraocular movements intact.     Conjunctiva/sclera: Conjunctivae normal.     Pupils: Pupils are equal, round, and reactive to light.  Neck:     Thyroid: No thyroid mass or thyromegaly.  Cardiovascular:     Rate and Rhythm: Normal rate and regular rhythm.     Pulses: Normal pulses.          Dorsalis pedis pulses are 2+ on the right side and 2+ on the left side.     Heart sounds: Normal heart sounds. No murmur heard.  No friction rub. No gallop.   Pulmonary:     Effort: Pulmonary effort is normal. No respiratory distress.     Breath sounds: Normal breath sounds.  Abdominal:     General: Abdomen is flat. Bowel sounds are normal. There is no distension.     Palpations: Abdomen is soft. There is no hepatomegaly, splenomegaly or mass.     Tenderness: There is no abdominal tenderness. There is no guarding or rebound.     Hernia: No hernia is present. There is no hernia in the left  inguinal area or right inguinal area.  Genitourinary:    General: Normal vulva.     Exam position: Lithotomy position.     Pubic Area: No rash or pubic lice.      Labia:  Right: No rash, tenderness, lesion or injury.        Left: No rash, tenderness, lesion or injury.      Urethra: No urethral pain, urethral swelling or urethral lesion.     Vagina: Normal. No signs of injury and foreign body. No vaginal discharge, erythema, tenderness, bleeding or lesions.     Cervix: Erythema present. No cervical motion tenderness, discharge, friability, lesion, cervical bleeding or eversion.     Uterus: Normal. Not enlarged and not tender.      Adnexa: Right adnexa normal and left adnexa normal.     Musculoskeletal:        General: Normal range of motion.     Cervical back: Normal range of motion and neck supple. No tenderness.     Right lower leg: No edema.     Left lower leg: No edema.     Comments: Normal tone, strength 5/5 BUE & BLE  Feet:     Right foot:     Skin integrity: Skin integrity normal.     Left foot:     Skin integrity: Skin integrity normal.  Lymphadenopathy:     Cervical: No cervical adenopathy.     Lower Body: No right inguinal adenopathy. No left inguinal adenopathy.  Skin:    General: Skin is warm and dry.     Capillary Refill: Capillary refill takes less than 2 seconds.  Neurological:     General: No focal deficit present.     Mental Status: She is alert and oriented to person, place, and time.     Cranial Nerves: No cranial nerve deficit.     Sensory: No sensory deficit.     Motor: No weakness.     Coordination: Coordination normal.     Gait: Gait normal.     Deep Tendon Reflexes: Reflexes normal.  Psychiatric:        Attention and Perception: Attention and perception normal.        Mood and Affect: Mood and affect normal.        Speech: Speech normal.        Behavior: Behavior normal. Behavior is cooperative.        Thought Content: Thought content  normal.        Cognition and Memory: Cognition and memory normal.        Judgment: Judgment normal.     Results for orders placed or performed in visit on 01/11/20  POCT Urinalysis Dipstick  Result Value Ref Range   Color, UA Yellow    Clarity, UA clear    Glucose, UA Negative Negative   Bilirubin, UA negative    Ketones, UA negative    Spec Grav, UA 1.020 1.010 - 1.025   Blood, UA trace    pH, UA 5.0 5.0 - 8.0   Protein, UA Negative Negative   Urobilinogen, UA 0.2 0.2 or 1.0 E.U./dL   Nitrite, UA negative    Leukocytes, UA Negative Negative   Appearance     Odor        Assessment & Plan:   Problem List Items Addressed This Visit      Other   Depression    Has been taking Wellbutrin XL 150mg  Conley with reported improvement in depression & anxiety.  Reports new situational anxiety event that has happened, which has increased symptoms somewhat.  Plan to continue on Wellbutrin XL at this time.  Plan: 1. Continue Wellbutrin XL 150mg  Conley.      Relevant Medications  traZODone (DESYREL) 50 MG tablet   Insomnia    Has tried hydroxyzine without change in her insomnia.  Discussed trying trazodone 25-50mg  to see if she has symptom relief with this.  Plan: 1. Begin trazodone 25-50mg  at bedtime for insomnia 2. Follow up in 3 months      Relevant Medications   traZODone (DESYREL) 50 MG tablet   Hyperlipidemia    Labs ordered for repeat lipid levels.  Plan: 1. Have labs drawn today      Relevant Orders   Lipid Profile   Routine medical exam    Annual physical exam without new findings.  Well adult with no acute concerns.  Plan: 1. Obtain health maintenance screenings as above according to age. - Increase physical activity to 30 minutes most days of the week.  - Eat healthy diet high in vegetables and fruits; low in refined carbohydrates. - Screening labs and tests as ordered 2. Return 1 year for annual physical.       Screening for malignant neoplasm of breast     Pt last mammogram 02/2018.  Plan: 1. Screening mammogram order placed.  Pt will call to schedule appointment.  Information given.       Relevant Orders   MM 3D SCREEN BREAST BILATERAL   Visit for gynecologic examination    Due for GYN exam and PAP testing.  Plan: 1. PAP testing taken and sent to the lab for processing.       Other Visit Diagnoses    Annual physical exam    -  Primary   Relevant Orders   POCT Urinalysis Dipstick (Completed)   Abnormal urinalysis       Relevant Orders   Urinalysis, microscopic only   Screening for colon cancer       Screening for cervical cancer       Relevant Orders   Cytology - PAP      Meds ordered this encounter  Medications  . traZODone (DESYREL) 50 MG tablet    Sig: Take 0.5-1 tablets (25-50 mg total) by mouth at bedtime as needed for sleep.    Dispense:  30 tablet    Refill:  3      Follow up plan: Return in about 1 year (around 01/10/2021) for CPE.  Harlin Rain, FNP-C Family Nurse Practitioner [facility] 01/11/2020, 9:41 AM

## 2020-01-11 NOTE — Patient Instructions (Addendum)
I have sent in a prescription for Trazodone 72m, to take 25-575mat bedtime, to help with sleep.  Well Visit: Care Instructions Overview  Well visits can help you stay healthy. Your provider has checked your overall health and may have suggested ways to take good care of yourself. Your provider also may have recommended tests. At home, you can help prevent illness with healthy eating, regular exercise, and other steps.  Follow-up care is a key part of your treatment and safety. Be sure to make and go to all appointments, and call your provider if you are having problems. It's also a good idea to know your test results and keep a list of the medicines you take.  How can you care for yourself at home?   Get screening tests that you and your doctor decide on. Screening helps find diseases before any symptoms appear.   Eat healthy foods. Choose fruits, vegetables, whole grains, protein, and low-fat dairy foods. Limit fat, especially saturated fat. Reduce salt in your diet.   Limit alcohol. If you are a man, have no more than 2 drinks a day or 14 drinks a week. If you are a woman, have no more than 1 drink a day or 7 drinks a week.   Get at least 30 minutes of physical activity on most days of the week.  We recommend you go no more than 2 days in a row without exercise. Walking is a good choice. You also may want to do other activities, such as running, swimming, cycling, or playing tennis or team sports. Discuss any changes in your exercise program with your provider.   Reach and stay at a healthy weight. This will lower your risk for many problems, such as obesity, diabetes, heart disease, and high blood pressure.   Do not smoke or allow others to smoke around you. If you need help quitting, talk to your provider about stop-smoking programs and medicines. These can increase your chances of quitting for good.  Can call 1-800-QUIT-NOW (1706-239-8477for the [facility] assistance with smoking cessation.   Care for your mental health. It is easy to get weighed down by worry and stress. Learn strategies to manage stress, like deep breathing and mindfulness, and stay connected with your family and community. If you find you often feel sad or hopeless, talk with your provider. Treatment can help.   Talk to your provider about whether you have any risk factors for sexually transmitted infections (STIs). You can help prevent STIs if you wait to have sex with a new partner (or partners) until you've each been tested for STIs. It also helps if you use condoms (female or female condoms) and if you limit your sex partners to one person who only has sex with you. Vaccines are available for some STIs, such as HPV (these are age dependent).   Use birth control if it's important to you to prevent pregnancy. Talk with your provider about the choices available and what might be best for you.   If you think you may have a problem with alcohol or drug use, talk to your provider. This includes prescription medicines (such as amphetamines and opioids) and illegal drugs (such as cocaine and methamphetamine). Your provider can help you figure out what type of treatment is best for you.   If you have concerns about domestic violence or intimate partner violence, there are resources available to you. National Domestic Abuse Hotline 1-(571)058-8316 Protect your skin from  too much sun. When you're outdoors from 10 a.m. to 4 p.m., stay in the shade or cover up with clothing and a hat with a wide brim. Wear sunglasses that block UV rays. Even when it's cloudy, put broad-spectrum sunscreen (SPF 30 or higher) on any exposed skin.   See a dentist one or two times a year for checkups and to have your teeth cleaned.   See an eye doctor once per year for an eye exam.   Wear a seat belt in the car.  When should you call for help?  Watch closely for changes in your health, and be sure to  contact your provider if you have any problems or symptoms that concern you.  For Mammogram screening for breast cancer   Call the [place] below anytime to schedule your own appointment now that order has been placed.  [facility] [place], [place] [address] Phone: [phone]  [facility] [address] [place], [place] [address] Phone: [phone]  The following recommendations are helpful adjuncts for helping rebalance your mood.  Eat a nourishing diet. Ensure adequate intake of calories, protein, carbs, fat, vitamins, and minerals. Prioritize whole foods at each meal, including meats, vegetables, fruits, nuts and seeds, etc.   Avoid inflammatory and/or "junk" foods, such as sugar, omega-6 fats, refined grains, chemicals, and preservatives are common in packaged and prepared foods. Minimize or completely avoid these ingredients and stick to whole foods with little to no additives. Cook from scratch as much as possible for more control over what you eat  Get enough sleep. Poor sleep is significantly associated with depression and anxiety. Make 7-9 hours of sleep nightly a top priority  Exercise appropriately. Exercise is known to improve brain functioning and boost mood. Aim for 30 minutes of daily physical activity. Avoid "overtraining," which can cause mental disturbances  Assess your light exposure. Not enough natural light during the day and too much artificial light can have a major impact on your mood. Get outside as often as possible during daylight hours. Minimize light exposure after dark and avoid the use of electronics that give off blue light before bed  Manage your stress.  Use daily stress management techniques such as meditation, yoga, or mindfulness to retrain your brain to respond differently to stress. Try deep breathing to deactivate your "fight or flight" response.  There are  many of sources with apps like Headspace, Calm or a variety of YouTube videos (videos from Gwynne Edinger have guided meditation)  Prioritize your social life. Work on building social support with new friends or improve current relationships. Consider getting a pet that allows for companionship, social interaction, and physical touch. Try volunteering or joining a faith-based community to increase your sense of purpose  4-7-8 breathing technique at bedtime: breathe in to count of 4, hold breath for count of 7, exhale for count of 8; do 3-5 times for letting go of overactive thoughts  Take time to play Unstructured "play" time can help reduce anxiety and depression Options for play include music, games, sports, dance, art, etc.  Try to add daily omega 3 fatty acids, magnesium, B complex, and balanced amino acid supplements to help improve mood and anxiety.  Sleep hygiene is the single most effective treatment for sleep issues, but it is hard work.  Tips for a good night's sleep:  -Keep sleep environment comfortable and conducive to sleep -Keep regular sleep schedule 7 nights  a week -Avoiding naps during the day -Avoiding going to bed until drowsy and ready to sleep, not trying to sleep, and not watching the clock -Get out of bed if not asleep within 15-20 minutes and returning only when drowsy -Avoiding caffeine, nicotine, alcohol, and other substances that interfere with sleep before bedtime -Take an hour before your set bedtime and start to wind down: bath/shower, no more TV or phone (the blue light can interfere with sleeping), listen to soothing music, or meditation -No TV in your bedroom -Exercising regularly, at least 6 hours before sleep. Yoga and Tai Chi can improve sleep quality  There are a lot of books and apps that may help guide you with any of the following:   -Progressive muscle relaxation (involves methodical tension and relaxation of different Muscle groups throughout  body)  Guided imagery  -YouTube - Gwynne Edinger has free videos on YouTube that can help with meditation and some   Abdominal breathing   Over the counter sleep aid one hour before bed- and gradually wean your use over 2-4 weeks  Some examples are : *Melatonin 5-10 mg *Sleepology (Can find on [employer]) taken according to packaging directions  There are a few online evidence based online programs, unfortunately they are not free.   Developed by a sleep expert who created a drug-free program for insomnia proven more effective than sleeping pills.  www.cbtforinsomnia.com Sleepio is an evidence-based digital sleep improvement program   www.sleepio.com SHUTi is designed to actively help retrain your body and mind for great sleep through six engaging Cognitive Behavioral Therapy for Insomnia strategy and learning sessions  [email or web address]  We will plan to see you back in 1 year for your physical  You will receive a survey after today's visit either digitally by e-mail or paper by [place] mail. Your experiences and feedback matter to [place].  Please respond so we know how we are doing as we provide care for you.  Call us with any questions/concerns/needs.  It is my goal to be available to you for your health concerns.  Thanks for choosing me to be a partner in your healthcare needs!  Harlin Rain, FNP-C Family Nurse Practitioner [facility] Phone: [phone]

## 2020-01-11 NOTE — Assessment & Plan Note (Signed)
Pt last mammogram 02/2018.  Plan: 1. Screening mammogram order placed.  Pt will call to schedule appointment.  Information given.

## 2020-01-11 NOTE — Assessment & Plan Note (Signed)
Due for GYN exam and PAP testing.  Plan: 1. PAP testing taken and sent to the lab for processing.

## 2020-01-11 NOTE — Assessment & Plan Note (Signed)
Labs ordered for repeat lipid levels.  Plan: 1. Have labs drawn today

## 2020-01-12 LAB — URINALYSIS, MICROSCOPIC ONLY

## 2020-01-12 LAB — LIPID PANEL
Cholesterol: 209 mg/dL — ABNORMAL HIGH (ref <200)
HDL: 50 mg/dL (ref >=50)
LDL Cholesterol (Calc): 134 mg/dL (calc) — ABNORMAL HIGH
Non-HDL Cholesterol (Calc): 159 mg/dL (calc) — ABNORMAL HIGH (ref <130)
Total CHOL/HDL Ratio: 4.2 (calc) (ref <5.0)
Triglycerides: 137 mg/dL (ref <150)

## 2020-01-16 LAB — CYTOLOGY - PAP
Comment: NEGATIVE
Diagnosis: NEGATIVE
High risk HPV: NEGATIVE

## 2020-01-24 ENCOUNTER — Ambulatory Visit
Admission: RE | Admit: 2020-01-24 | Discharge: 2020-01-24 | Disposition: A | Discharge status: Home / Self Care | Payer: BC Managed Care – PPO | Source: Ambulatory Visit | Attending: Family Medicine | Admitting: Family Medicine

## 2020-01-24 DIAGNOSIS — Z1231 Encounter for screening mammogram for malignant neoplasm of breast: Secondary | ICD-10-CM | POA: Insufficient documentation

## 2021-01-23 DIAGNOSIS — M5137 Other intervertebral disc degeneration, lumbosacral region: Secondary | ICD-10-CM | POA: Diagnosis not present

## 2021-01-23 DIAGNOSIS — M545 Low back pain, unspecified: Secondary | ICD-10-CM | POA: Diagnosis not present

## 2021-01-23 DIAGNOSIS — M5136 Other intervertebral disc degeneration, lumbar region: Secondary | ICD-10-CM | POA: Diagnosis not present

## 2021-01-23 DIAGNOSIS — M9903 Segmental and somatic dysfunction of lumbar region: Secondary | ICD-10-CM | POA: Diagnosis not present

## 2021-01-28 DIAGNOSIS — M545 Low back pain, unspecified: Secondary | ICD-10-CM | POA: Diagnosis not present

## 2021-01-28 DIAGNOSIS — M5137 Other intervertebral disc degeneration, lumbosacral region: Secondary | ICD-10-CM | POA: Diagnosis not present

## 2021-01-28 DIAGNOSIS — M5136 Other intervertebral disc degeneration, lumbar region: Secondary | ICD-10-CM | POA: Diagnosis not present

## 2021-01-28 DIAGNOSIS — M9903 Segmental and somatic dysfunction of lumbar region: Secondary | ICD-10-CM | POA: Diagnosis not present
